# Patient Record
Sex: Male | Born: 1995 | Hispanic: No | Smoking: Never smoker
Health system: Southern US, Community
[De-identification: ages and names within clinical notes are randomized; demographics above are authoritative.]

## PROBLEM LIST (undated history)

## (undated) DIAGNOSIS — F32A Depression, unspecified: Secondary | ICD-10-CM

## (undated) DIAGNOSIS — F329 Major depressive disorder, single episode, unspecified: Secondary | ICD-10-CM

---
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
## 2017-05-04 ENCOUNTER — Encounter: Payer: Self-pay | Admitting: Podiatry

## 2017-05-04 ENCOUNTER — Ambulatory Visit (INDEPENDENT_AMBULATORY_CARE_PROVIDER_SITE_OTHER): Payer: 59 | Admitting: Podiatry

## 2017-05-04 ENCOUNTER — Ambulatory Visit (INDEPENDENT_AMBULATORY_CARE_PROVIDER_SITE_OTHER): Payer: 59

## 2017-05-04 VITALS — BP 105/67 | HR 63 | Resp 16

## 2017-05-04 DIAGNOSIS — M2141 Flat foot [pes planus] (acquired), right foot: Secondary | ICD-10-CM

## 2017-05-04 DIAGNOSIS — M2142 Flat foot [pes planus] (acquired), left foot: Secondary | ICD-10-CM | POA: Diagnosis not present

## 2017-05-04 NOTE — Progress Notes (Signed)
  Subjective:  Patient ID: Matthew Mcintosh, male    DOB: Jan 02, 1996,  MRN: [id number] HPI Chief Complaint  Patient presents with  . Foot Pain    Medial foot bilateral - aching x years, getting worse, feet pronating, can't stand or walk for any length of time without pain, tried mulitple OTC inserts-no help  . New Patient (Initial Visit)    22 y.o. male presents with the above complaint.   ROS: Denies fever chills nausea vomiting muscle aches pains calf pain shortness of breath chest pain or headache.  No past medical history on file.   Current Outpatient Medications:  .  sertraline (ZOLOFT) 100 MG tablet, TAKE 2 TABLETS BY MOUTH EVERY MORNING, Disp: , Rfl:  .  DENTA 5000 PLUS 1.1 % CREA dental cream, APPLY A PEA-SIZED AMOUNT OF PASTE TO TOOTHBRUSH AND BRUSH FOR 2 MINS AT BEDTIME, Disp: , Rfl: 2  Allergies  Allergen Reactions  . Other     Cat dander   Review of Systems Objective:   Vitals:   05/04/17 1036  BP: 105/67  Pulse: 63  Resp: 16    General: Well developed, nourished, in no acute distress, alert and oriented x3   Dermatological: Skin is warm, dry and supple bilateral. Nails x 10 are well maintained; remaining integument appears unremarkable at this time. There are no open sores, no preulcerative lesions, no rash or signs of infection present.  Vascular: Dorsalis Pedis artery and Posterior Tibial artery pedal pulses are 2/4 bilateral with immedate capillary fill time. Pedal hair growth present. No varicosities and no lower extremity edema present bilateral.   Neruologic: Grossly intact via light touch bilateral. Vibratory intact via tuning fork bilateral. Protective threshold with Semmes Wienstein monofilament intact to all pedal sites bilateral. Patellar and Achilles deep tendon reflexes 2+ bilateral. No Babinski or clonus noted bilateral.   Musculoskeletal: No gross boney pedal deformities bilateral. No pain, crepitus, or limitation noted with foot and ankle  range of motion bilateral. Muscular strength 5/5 in all groups tested bilateral.  Flexible flatfoot deformity bilateral.  Not able to reproduce pain on palpation.  Gait: Unassisted, Nonantalgic.    Radiographs:  3 views radiographs taken today demonstrate no acute findings.  Chronic findings consistent of moderate to severe pes planus bilaterally.  Assessment & Plan:   Assessment: Pes planus bilateral.  Calcaneal valgus bilateral.  Plan: Discussed etiology pathology conservative versus surgical therapies.  At this point were going get him into a set of orthotics.     Meleane Selinger T. RavenaHyatt, [place] Dakota[place]PM

## 2017-05-04 NOTE — Progress Notes (Signed)
F/O scanned/ordered to address pes planovalgus deformity: plan hug arch, 4* b/l RF kirby skive, 4mm deep heel cup, spenco cover

## 2017-05-25 ENCOUNTER — Ambulatory Visit: Payer: 59 | Admitting: Orthotics

## 2017-05-25 DIAGNOSIS — M2141 Flat foot [pes planus] (acquired), right foot: Secondary | ICD-10-CM

## 2017-05-25 DIAGNOSIS — M2142 Flat foot [pes planus] (acquired), left foot: Principal | ICD-10-CM

## 2017-05-25 NOTE — Progress Notes (Signed)
Patient came in today to pick up custom made foot orthotics.  The goals were accomplished and the patient reported no dissatisfaction with said orthotics.  Patient was advised of breakin period and how to report any issues. 

## 2018-09-13 ENCOUNTER — Ambulatory Visit: Payer: 59 | Admitting: Psychiatry

## 2018-11-27 ENCOUNTER — Telehealth: Payer: Self-pay | Admitting: Psychiatry

## 2018-11-27 DIAGNOSIS — F411 Generalized anxiety disorder: Secondary | ICD-10-CM

## 2018-11-27 MED ORDER — SERTRALINE HCL 100 MG PO TABS: 100.0000 mg | ORAL_TABLET | Freq: Every day | ORAL | 0 refills | Status: AC

## 2018-11-27 NOTE — Telephone Encounter (Signed)
Pharmacy faxes for refill of yearly prescription of 09/13/2017 not coming for the 09/13/2018 appointment therefore not likely taking the medication but pharmacy asks for refill sent as #30 as last possible without appointment

## 2019-05-18 ENCOUNTER — Other Ambulatory Visit: Payer: Self-pay

## 2019-05-18 ENCOUNTER — Encounter (HOSPITAL_COMMUNITY): Payer: Self-pay | Admitting: Emergency Medicine

## 2019-05-18 ENCOUNTER — Emergency Department (HOSPITAL_COMMUNITY)
Admission: EM | Admit: 2019-05-18 | Discharge: 2019-05-18 | Disposition: A | Discharge status: Home / Self Care | Payer: No Typology Code available for payment source | Attending: Emergency Medicine | Admitting: Emergency Medicine

## 2019-05-18 ENCOUNTER — Emergency Department (HOSPITAL_COMMUNITY): Payer: No Typology Code available for payment source

## 2019-05-18 DIAGNOSIS — S62001A Unspecified fracture of navicular [scaphoid] bone of right wrist, initial encounter for closed fracture: Secondary | ICD-10-CM | POA: Insufficient documentation

## 2019-05-18 DIAGNOSIS — Z79899 Other long term (current) drug therapy: Secondary | ICD-10-CM | POA: Diagnosis not present

## 2019-05-18 DIAGNOSIS — Y9289 Other specified places as the place of occurrence of the external cause: Secondary | ICD-10-CM | POA: Insufficient documentation

## 2019-05-18 DIAGNOSIS — Y990 Civilian activity done for income or pay: Secondary | ICD-10-CM | POA: Insufficient documentation

## 2019-05-18 DIAGNOSIS — S52101A Unspecified fracture of upper end of right radius, initial encounter for closed fracture: Secondary | ICD-10-CM | POA: Diagnosis not present

## 2019-05-18 DIAGNOSIS — W11XXXA Fall on and from ladder, initial encounter: Secondary | ICD-10-CM | POA: Insufficient documentation

## 2019-05-18 DIAGNOSIS — S6991XA Unspecified injury of right wrist, hand and finger(s), initial encounter: Secondary | ICD-10-CM | POA: Diagnosis present

## 2019-05-18 DIAGNOSIS — Y9389 Activity, other specified: Secondary | ICD-10-CM | POA: Diagnosis not present

## 2019-05-18 HISTORY — DX: Depression, unspecified: F32.A

## 2019-05-18 MED ORDER — HYDROCODONE-ACETAMINOPHEN 5-325 MG PO TABS
1.0000 | ORAL_TABLET | Freq: Once | ORAL | Status: AC
  Administered 2019-05-18: 14:00:00 1 via ORAL
  Filled 2019-05-18: qty 1

## 2019-05-18 MED ORDER — HYDROCODONE-ACETAMINOPHEN 5-325 MG PO TABS: 1.0000 | ORAL_TABLET | Freq: Four times a day (QID) | ORAL | 0 refills | Status: AC | PRN

## 2019-05-18 MED ORDER — IBUPROFEN 400 MG PO TABS
600.0000 mg | ORAL_TABLET | Freq: Once | ORAL | Status: AC
  Administered 2019-05-18: 600 mg via ORAL
  Filled 2019-05-18: qty 1

## 2019-05-18 NOTE — ED Notes (Signed)
PA Adelina Mings gave discharge paperwork to Pt and pt walked out before I could get discharge vitals and signature for discharge. PA Adelina Mings stated she went over paperwork with pt at bedside.

## 2019-05-18 NOTE — ED Triage Notes (Signed)
Pt fell at work last night.  Reports R wrist pain.  Seen at [facility] today and sent to ED for orthopedic consult of R wrist fracture.

## 2019-05-18 NOTE — ED Provider Notes (Signed)
Care assumed from [facility] at shift change.  Patient had fall from a ladder at work, found to have right displaced scaphoid fracture, possible triquetral fracture, and nondisplaced fracture of the proximal radial head.  Case was initially discussed with Dr. Susa Simmonds with orthopedics by initial provider who recommended hand consultation regarding scaphoid fracture and asked if they would also manage proximal radius fracture.  At shift change consult call pending with Dr. Izora Ribas with hand surgery.  5:30 PM received call back from Dr. Izora Ribas, who recommends splinting with follow-up in the office, but does not manage proximal radial head fractures, recommends general orthopedic follow-up.  5:40 PM I again discussed case with Dr. Susa Simmonds who recommends sling and early mobilization for nondisplaced radial head fracture.  I discussed these plans with the patient, he has been placed in a thumb spica splint and provided a sling.  Given range of motion exercises.  Orthopedic follow-up with Dr. Susa Simmonds, and hand follow-up with Dr. Izora Ribas for management of these fractures.  Encouraged to use NSAIDs, and hydrocodone prescribed for breakthrough pain.  Stressed the importance of ice and elevation.  Patient expresses understanding and agreement with plan.  Discharged home in good condition.  [facility] Injury Treatment  Date/Time: 05/18/2019 6:14 PM Performed by: Dartha Lodge, PA-C Authorized by: Dartha Lodge, PA-C   Consent:    Consent obtained:  Verbal   Consent given by:  PatientInjury location: wrist Location details: right wrist Injury type: fracture Fracture type: scaphoid Pre-procedure neurovascular assessment: neurovascularly intact Pre-procedure distal perfusion: normal Pre-procedure neurological function: normal Pre-procedure range of motion: reduced Pre-procedure range of motion comment: Reduced 2/2/ pain Immobilization: splint and sling Splint type: thumb spica Supplies used:  Ortho-Glass Post-procedure neurovascular assessment: post-procedure neurovascularly intact Post-procedure distal perfusion: normal Post-procedure neurological function: normal Post-procedure range of motion comment: Joint immobilzed with splint Patient tolerance: patient tolerated the procedure well with no immediate complications    Final diagnoses:  Closed displaced fracture of scaphoid of right wrist, unspecified portion of scaphoid, initial encounter  Closed fracture of proximal end of right radius, unspecified fracture morphology, initial encounter   ED Discharge Orders         Ordered    HYDROcodone-acetaminophen (NORCO/VICODIN) 5-325 MG tablet  Every 6 hours PRN     05/18/19 1625               Dartha Lodge, PA-C 05/18/19 1937    Tilden Fossa, MD 05/19/19 [phone]

## 2019-05-18 NOTE — ED Notes (Signed)
Patient transported to X-ray 

## 2019-05-18 NOTE — ED Notes (Signed)
Ortho tech notified for thumb spica

## 2019-05-18 NOTE — ED Provider Notes (Signed)
[facility] EMERGENCY DEPARTMENT Provider Note   CSN: [id number] Arrival date & time: 05/18/19  1157     History Chief Complaint  Patient presents with  . Wrist Pain    Elizah Mierzwa is a 24 y.o. male.  HPI HPI Comments: Riyan Haile is a 24 y.o. male who presents to the Emergency Department complaining of sudden onset right arm pain.  Patient was on a ladder at work last night and slipped at a height he believes was about 8 feet and landed on a concrete surface.  He reports immediate pain to his right lower arm between the elbow and wrist immediately which has continued to worsen.  He took 800 mg of ibuprofen about 12 hours ago without any significant relief.  He was seen at an urgent care this morning and told that he had a fracture to his right wrist and that he needed an "orthopedic consultation" and was sent to the emergency department.  He reports significant pain circumferentially around the right wrist and elbow.  His pain worsens with any movement as well as palpation of the regions.  He denies head trauma, syncope, nausea, vomiting, numbness.    Past Medical History:  Diagnosis Date  . Depression     There are no problems to display for this patient.   History reviewed. No pertinent surgical history.     No family history on file.  Social History   Tobacco Use  . Smoking status: Never Smoker  . Smokeless tobacco: Never Used  Substance Use Topics  . Alcohol use: Never  . Drug use: Never    Home Medications Prior to Admission medications   Medication Sig Start Date End Date Taking? Authorizing Provider  DENTA 5000 PLUS 1.1 % CREA dental cream APPLY A PEA-SIZED AMOUNT OF PASTE TO TOOTHBRUSH AND BRUSH FOR 2 MINS AT BEDTIME 04/25/17   [provider]  sertraline (ZOLOFT) 100 MG tablet Take 1 tablet (100 mg total) by mouth daily after breakfast. 11/27/18   Delight Hoh, MD    Allergies    Other  Review of Systems     Review of Systems  Gastrointestinal: Negative for nausea and vomiting.  Musculoskeletal: Positive for arthralgias, joint swelling and myalgias. Negative for neck pain and neck stiffness.  Skin: Positive for color change.  Neurological: Negative for weakness and numbness.   Physical Exam Updated Vital Signs BP 140/85 (BP Location: Left Arm)   Pulse 68   Temp 98.2 F (36.8 C) (Oral)   Resp 14   Ht 6\' 9"  (2.057 m)   Wt 99.8 kg   SpO2 100%   BMI 23.58 kg/m   Physical Exam Vitals and nursing note reviewed.  Constitutional:      General: He is in acute distress.     Appearance: Normal appearance. He is normal weight. He is not ill-appearing, toxic-appearing or diaphoretic.  HENT:     Head: Normocephalic and atraumatic.     Nose: Nose normal.     Mouth/Throat:     Mouth: Mucous membranes are moist.  Eyes:     Extraocular Movements: Extraocular movements intact.  Cardiovascular:     Rate and Rhythm: Normal rate.     Pulses: Normal pulses.  Pulmonary:     Effort: Pulmonary effort is normal.  Abdominal:     General: Abdomen is flat.  Musculoskeletal:        General: Swelling, tenderness and signs of injury present. No deformity.     Cervical  back: Normal range of motion.     Comments: Exquisite tenderness noted circumferentially around the right wrist.  Significant tenderness also noted on the posterior aspect of the right elbow.  Mild edema noted at both the wrist and elbow.  Unable to assess range of motion of the right elbow and wrist secondary to pain.  Full range of motion of the fingers of the right hand.  Good cap refill noted in the right hand.  2+ radial pulses noted.  Distal sensation is intact.  Skin:    General: Skin is warm and dry.     Capillary Refill: Capillary refill takes less than 2 seconds.  Neurological:     General: No focal deficit present.     Mental Status: He is alert and oriented to person, place, and time.  Psychiatric:        Mood and Affect: Mood  normal.        Behavior: Behavior normal.    ED Results / Procedures / Treatments   Labs (all labs ordered are listed, but only abnormal results are displayed) Labs Reviewed - No data to display  EKG None  Radiology DG Elbow Complete Right  Result Date: 05/18/2019 CLINICAL DATA:  Fall from ladder last night, was told at urgent care he had a right wrist fracture. Right wrist pain. EXAM: RIGHT ELBOW - COMPLETE 3+ VIEW COMPARISON:  None. FINDINGS: Minimal buckling of the radial head neck junction may represent an acute nondisplaced fracture. Assessment or joint effusion is limited on the lateral view due to positioning, however joint effusion is suspected. No other acute fracture. Soft tissue edema about the posterior elbow. IMPRESSION: 1. Minimal buckling of the radial head neck junction may represent an acute nondisplaced fracture. Suspected joint effusion. 2. Soft tissue edema about the posterior elbow. Electronically Signed   By: Narda Rutherford M.D.   On: 05/18/2019 15:01   DG Wrist Complete Right  Result Date: 05/18/2019 CLINICAL DATA:  Fall from ladder last night, was told at urgent care he had a right wrist fracture. Right wrist pain. EXAM: RIGHT WRIST - COMPLETE 3+ VIEW COMPARISON:  None. FINDINGS: Mildly displaced mid scaphoid fracture with up to 2 mm osseous distraction. There is a questionable triquetrum fracture about the radial aspect, however not seen in typical location about the dorsal aspect of the wrist. The joint spaces are normal. Radiocarpal alignment is maintained. Diffuse soft tissue edema. Overlying splint in place. IMPRESSION: 1. Mildly displaced mid scaphoid fracture with up to 2 mm osseous distraction. 2. Questionable but not definite triquetrum fracture. Electronically Signed   By: Narda Rutherford M.D.   On: 05/18/2019 15:04    Procedures Procedures (including critical care time)  Medications Ordered in ED Medications  HYDROcodone-acetaminophen (NORCO/VICODIN)  5-325 MG per tablet 1 tablet (1 tablet Oral Given 05/18/19 1358)    ED Course  I have reviewed the triage vital signs and the nursing notes.  Pertinent labs & imaging results that were available during my care of the patient were reviewed by me and considered in my medical decision making (see chart for details).    MDM Rules/Calculators/A&P                      3:32 PM patient is a pleasant 24 year old male who presents due to a fall yesterday that resulted in what he was told was a right broken wrist.  He was sent over from urgent care and states that he needs an orthopedic  consultation.  He has exquisite pain over the right wrist and elbow.  It was difficult to examine range of motion secondary to this.  He is neurovascularly intact in the right hand.  He has full range of motion of the fingers of the right hand.  I have given him a Norco for pain.  I have obtained new images of the right wrist and elbow showing minimal buckling of the radial head neck junction which may represent an acute nondisplaced fracture.  On the right wrist there is a mildly displaced mid scaphoid fracture with up to 2 mm of osseous distraction.  The radiologist notes question of but not definite triquetrium fracture.  Will discuss with on-call orthopedic surgeon.  3:47 PM patient states his current pain is down to 6/10 from 9/10 when arriving.  I spoke to Dr. Susa Simmonds regarding this patient and he recommends that I consult with hand additionally due to the scaphoid displacement.  He stated that he can manage the elbow himself but if they would like to manage the entire arm he would be amenable.  Will consult with on-call hand surgeon.  4:28 PM it is the end of my shift and patient care is being transferred to [facility], [place].  I have updated her on this patient.  I additionally have already sent in a short prescription for Norco for breakthrough pain.  She will discuss with hand surgery and update the patient.  Final Clinical  Impression(s) / ED Diagnoses Final diagnoses:  Closed displaced fracture of scaphoid of right wrist, unspecified portion of scaphoid, initial encounter    Rx / DC Orders ED Discharge Orders    None       Placido Sou, PA-C 05/18/19 1631    Tilden Fossa, MD 05/19/19 1108

## 2019-05-18 NOTE — Progress Notes (Signed)
Orthopedic Tech Progress Note Patient Details:  Matthew Mcintosh 08/17/1995 [id number]  Ortho Devices Type of Ortho Device: Thumb spica splint Splint Material: Fiberglass Ortho Device/Splint Location: rue Ortho Device/Splint Interventions: Ordered, Application, Adjustment  pt already had a sling Post Interventions Patient Tolerated: Well Instructions Provided: Care of device, Adjustment of device   [name] 05/18/2019, 9:32 PM

## 2019-05-18 NOTE — Discharge Instructions (Addendum)
Please take ibuprofen 600 mg every 6 hours for pain.  Ice and elevate the arm and wrist. You have been prescribed a short course of Norco which is a strong narcotic.  Please only take this as needed for breakthrough pain for the next few days.  You can take every 6-8 hours as needed.  This can be a constipating medication so please be sure to increase your fiber intake through fruits and vegetables or take a stool softener.  Call to schedule follow-up appointments with Dr. Izora Ribas for your wrist fracture, and Dr. Susa Simmonds for your elbow fracture.  If you have any new or worsening symptoms please do not hesitate to return to the emergency department.  If you develop discoloration of the fingers, numbness or tingling return to the emergency department immediately, if this occurs you can remove the splint.  It was a pleasure to meet you.

## 2019-11-20 ENCOUNTER — Encounter: Payer: Self-pay | Admitting: Psychiatry

## 2021-11-10 IMAGING — DX DG ELBOW COMPLETE 3+V*R*
4 series · 4 of 4 positions shown · non-contrast
Comparison: None.

CLINICAL DATA: Fall from ladder last night, was told at [HOSPITAL]
he had a right wrist fracture. Right wrist pain.

EXAM:
RIGHT ELBOW - COMPLETE 3+ VIEW

[elbow ap]
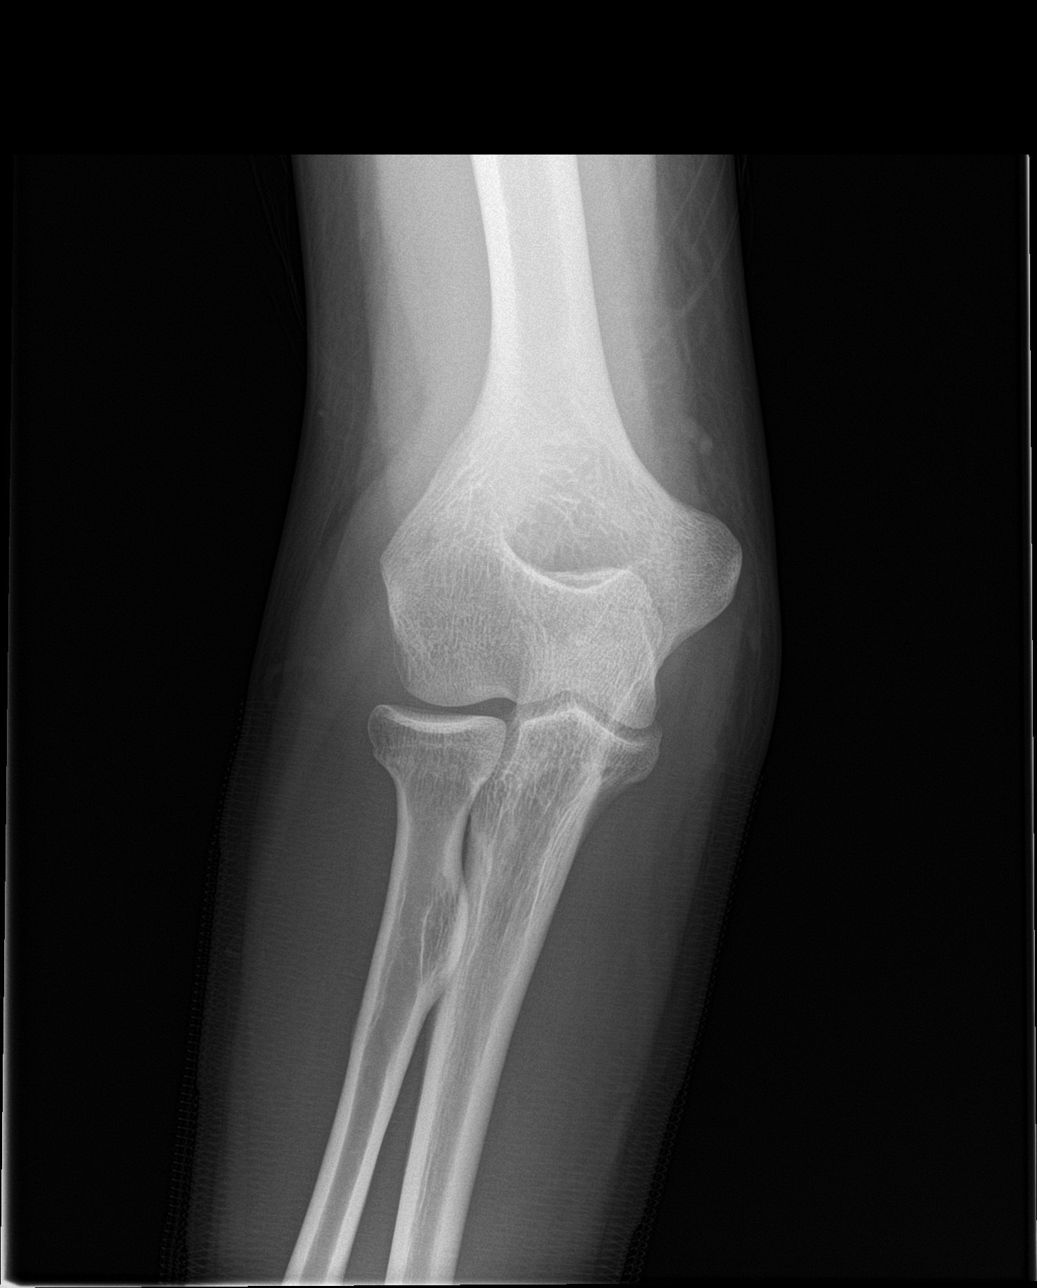

[elbow obl (1 of 2)]
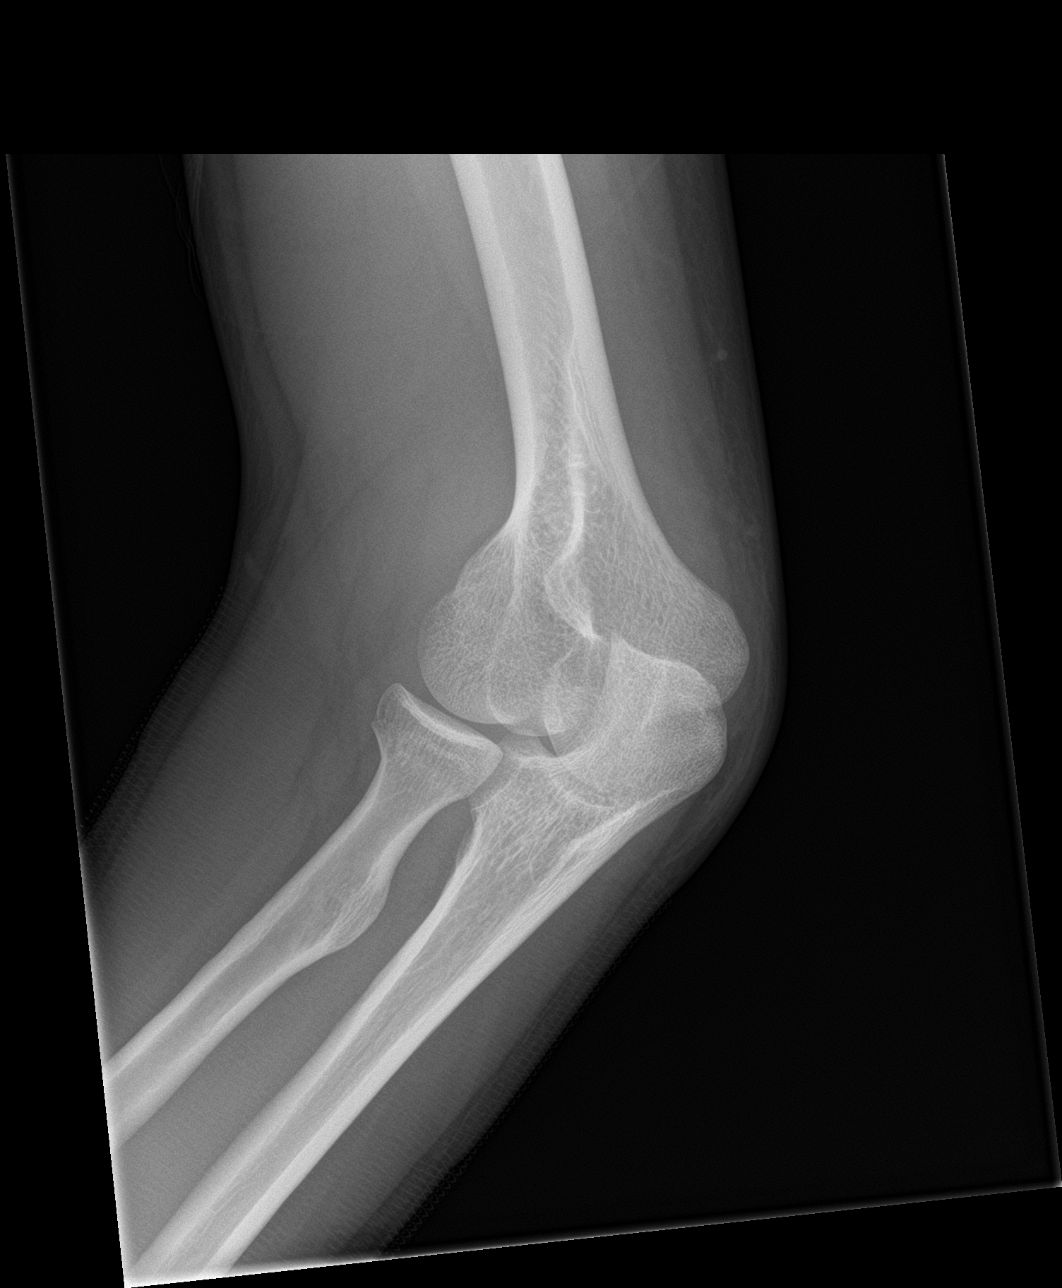

[elbow obl (2 of 2)]
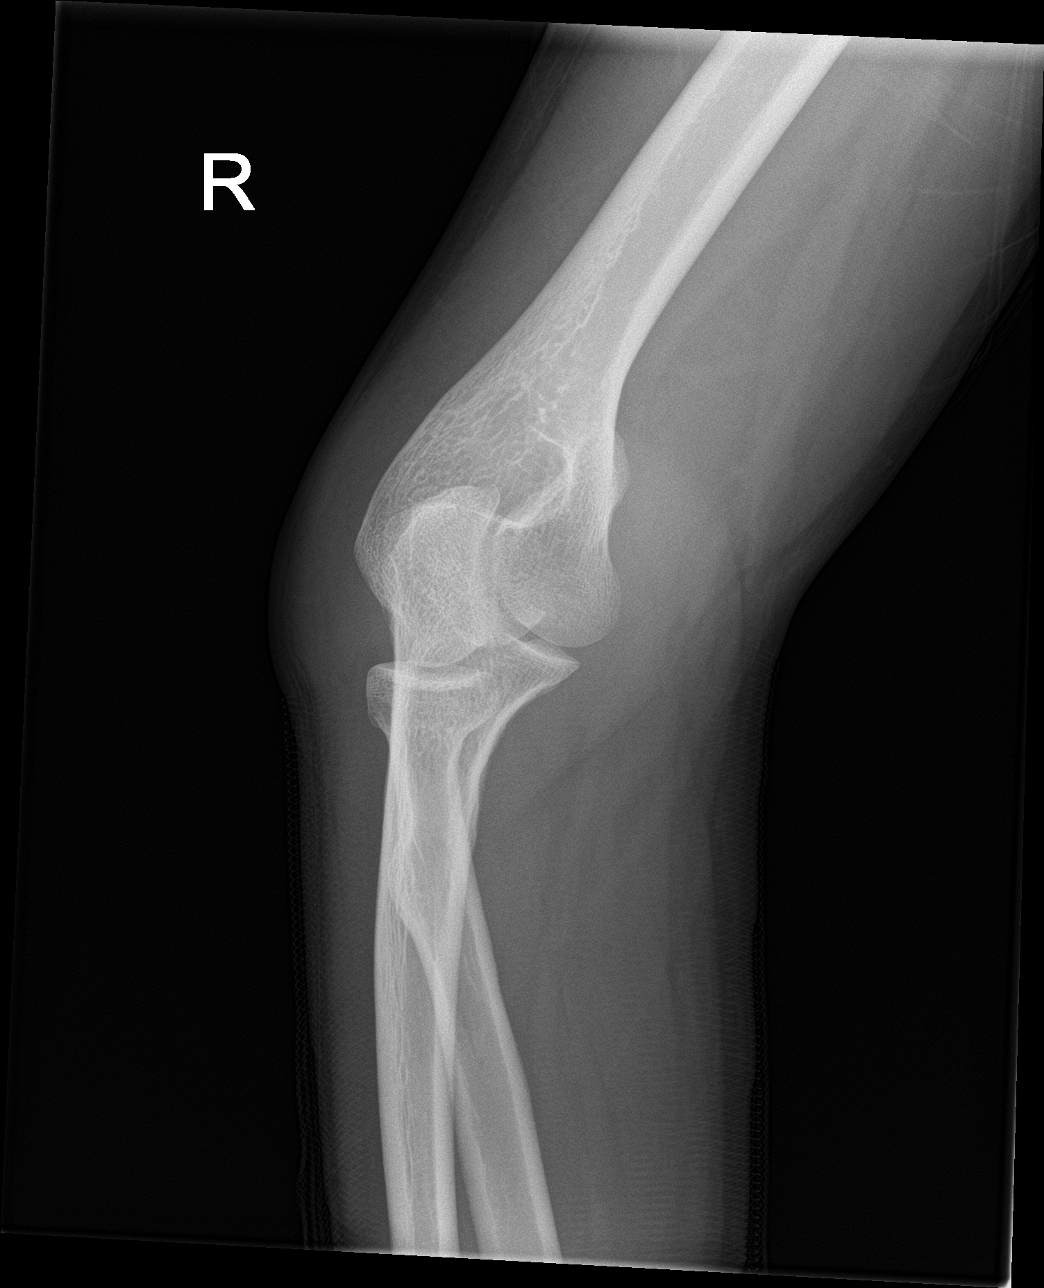

[elbow lat]
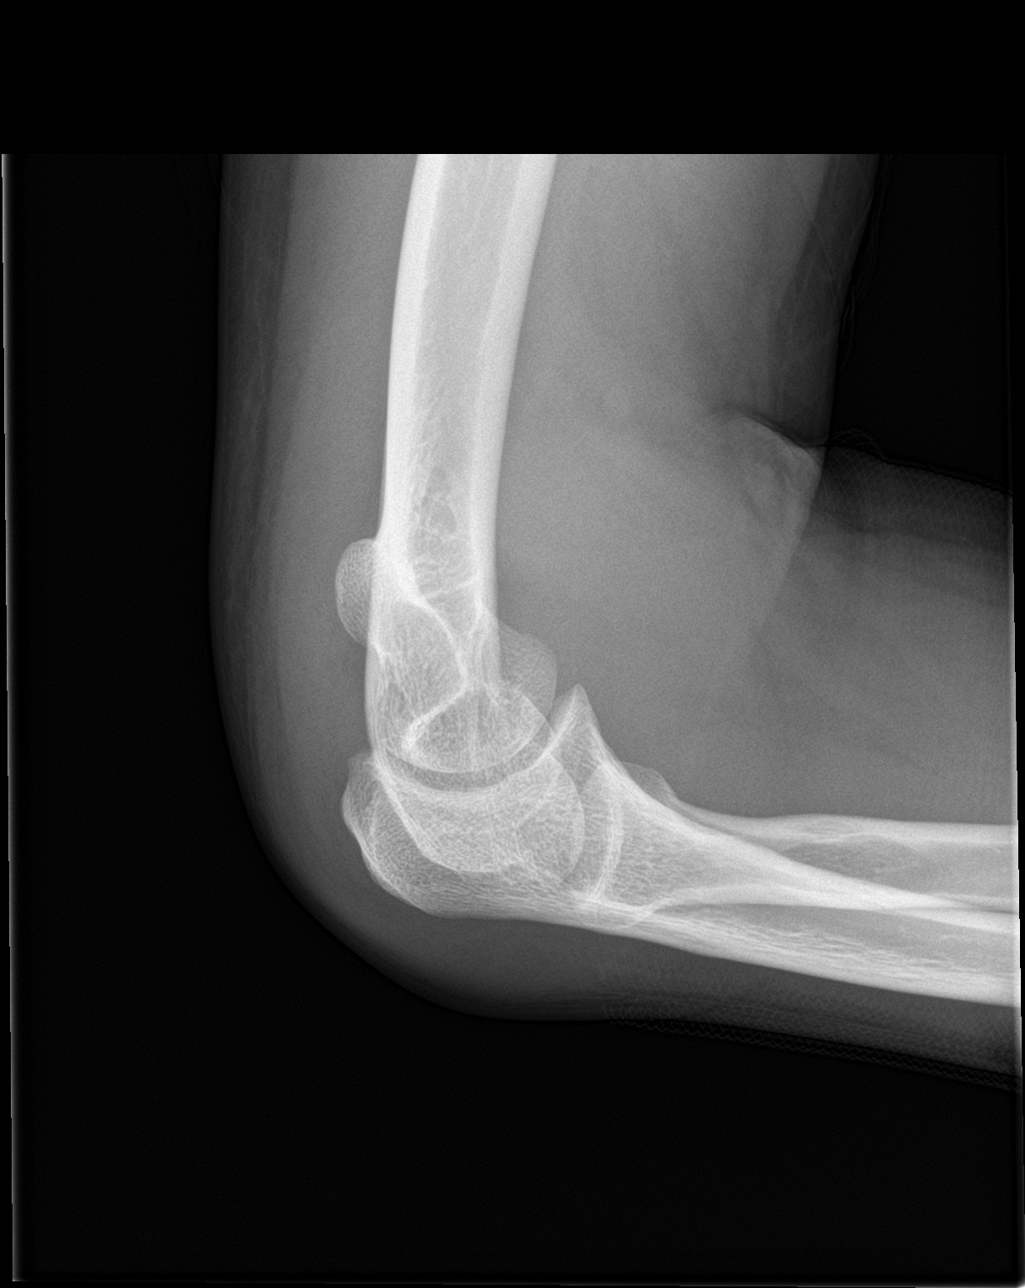

[4 of 4 positions shown; findings below may reference images not displayed]

FINDINGS: Minimal buckling of the radial head neck junction may represent an
acute nondisplaced fracture. Assessment or joint effusion is limited
on the lateral view due to positioning, however joint effusion is
suspected. No other acute fracture. Soft tissue edema about the
posterior elbow.
IMPRESSION: 1. Minimal buckling of the radial head neck junction may represent
an acute nondisplaced fracture. Suspected joint effusion.
2. Soft tissue edema about the posterior elbow.
